# Patient Record
Sex: Female | Born: 1985 | Hispanic: Yes | Smoking: Never smoker
Health system: Southern US, Community
[De-identification: ages and names within clinical notes are randomized; demographics above are authoritative.]

## PROBLEM LIST (undated history)

## (undated) DIAGNOSIS — G459 Transient cerebral ischemic attack, unspecified: Secondary | ICD-10-CM

---

## 2015-03-19 ENCOUNTER — Encounter (HOSPITAL_COMMUNITY): Payer: Self-pay | Admitting: Emergency Medicine

## 2015-03-19 ENCOUNTER — Emergency Department (HOSPITAL_COMMUNITY): Payer: Self-pay

## 2015-03-19 ENCOUNTER — Emergency Department (HOSPITAL_COMMUNITY)
Admission: EM | Admit: 2015-03-19 | Discharge: 2015-03-19 | Disposition: A | Discharge status: Home / Self Care | Payer: Self-pay | Attending: Emergency Medicine | Admitting: Emergency Medicine

## 2015-03-19 DIAGNOSIS — Z8673 Personal history of transient ischemic attack (TIA), and cerebral infarction without residual deficits: Secondary | ICD-10-CM | POA: Insufficient documentation

## 2015-03-19 DIAGNOSIS — Z3202 Encounter for pregnancy test, result negative: Secondary | ICD-10-CM | POA: Insufficient documentation

## 2015-03-19 DIAGNOSIS — F439 Reaction to severe stress, unspecified: Secondary | ICD-10-CM | POA: Insufficient documentation

## 2015-03-19 DIAGNOSIS — R0789 Other chest pain: Secondary | ICD-10-CM | POA: Insufficient documentation

## 2015-03-19 DIAGNOSIS — F419 Anxiety disorder, unspecified: Secondary | ICD-10-CM | POA: Insufficient documentation

## 2015-03-19 HISTORY — DX: Transient cerebral ischemic attack, unspecified: G45.9

## 2015-03-19 LAB — CBC
HCT: 37.7 % (ref 36.0–46.0)
HEMOGLOBIN: 12 g/dL (ref 12.0–15.0)
MCH: 28.2 pg (ref 26.0–34.0)
MCHC: 31.8 g/dL (ref 30.0–36.0)
MCV: 88.7 fL (ref 78.0–100.0)
PLATELETS: 250 10*3/uL (ref 150–400)
RBC: 4.25 MIL/uL (ref 3.87–5.11)
RDW: 12.3 % (ref 11.5–15.5)
WBC: 5.1 10*3/uL (ref 4.0–10.5)

## 2015-03-19 LAB — BASIC METABOLIC PANEL
ANION GAP: 8 (ref 5–15)
BUN: <5 mg/dL — ABNORMAL LOW (ref 6–20)
CHLORIDE: 107 mmol/L (ref 101–111)
CO2: 26 mmol/L (ref 22–32)
Calcium: 9.5 mg/dL (ref 8.9–10.3)
Creatinine, Ser: 0.71 mg/dL (ref 0.44–1.00)
GFR calc non Af Amer: >60 mL/min (ref >60)
Glucose, Bld: 79 mg/dL (ref 65–99)
Potassium: 4.4 mmol/L (ref 3.5–5.1)
Sodium: 141 mmol/L (ref 135–145)

## 2015-03-19 LAB — CBG MONITORING, ED: Glucose-Capillary: 84 mg/dL (ref 65–99)

## 2015-03-19 LAB — I-STAT TROPONIN, ED: TROPONIN I, POC: 0 ng/mL (ref 0.00–0.08)

## 2015-03-19 LAB — I-STAT BETA HCG BLOOD, ED (MC, WL, AP ONLY)

## 2015-03-19 MED ORDER — DIAZEPAM 5 MG PO TABS: 5.0000 mg | ORAL_TABLET | Freq: Two times a day (BID) | ORAL | Status: AC | PRN

## 2015-03-19 MED ORDER — FAMOTIDINE 20 MG PO TABS: 20.0000 mg | ORAL_TABLET | Freq: Two times a day (BID) | ORAL | Status: AC

## 2015-03-19 MED ORDER — GI COCKTAIL ~~LOC~~
30.0000 mL | Freq: Once | ORAL | Status: AC
  Administered 2015-03-19: 30 mL via ORAL
  Filled 2015-03-19: qty 30

## 2015-03-19 NOTE — Discharge Instructions (Signed)
Read the information below.  Use the prescribed medication as directed.  Please discuss all new medications with your pharmacist.  You may return to the Emergency Department at any time for worsening condition or any new symptoms that concern you.   If you develop worsening chest pain, shortness of breath, fever, you pass out, or become weak or dizzy, return to the ER for a recheck.      Nonspecific Chest Pain  Chest pain can be caused by many different conditions. There is always a chance that your pain could be related to something serious, such as a heart attack or a blood clot in your lungs. Chest pain can also be caused by conditions that are not life-threatening. If you have chest pain, it is very important to follow up with your health care provider. CAUSES  Chest pain can be caused by:  Heartburn.  Pneumonia or bronchitis.  Anxiety or stress.  Inflammation around your heart (pericarditis) or lung (pleuritis or pleurisy).  A blood clot in your lung.  A collapsed lung (pneumothorax). It can develop suddenly on its own (spontaneous pneumothorax) or from trauma to the chest.  Shingles infection (varicella-zoster virus).  Heart attack.  Damage to the bones, muscles, and cartilage that make up your chest wall. This can include:  Bruised bones due to injury.  Strained muscles or cartilage due to frequent or repeated coughing or overwork.  Fracture to one or more ribs.  Sore cartilage due to inflammation (costochondritis). RISK FACTORS  Risk factors for chest pain may include:  Activities that increase your risk for trauma or injury to your chest.  Respiratory infections or conditions that cause frequent coughing.  Medical conditions or overeating that can cause heartburn.  Heart disease or family history of heart disease.  Conditions or health behaviors that increase your risk of developing a blood clot.  Having had chicken pox (varicella zoster). SIGNS AND  SYMPTOMS Chest pain can feel like:  Burning or tingling on the surface of your chest or deep in your chest.  Crushing, pressure, aching, or squeezing pain.  Dull or sharp pain that is worse when you move, cough, or take a deep breath.  Pain that is also felt in your back, neck, shoulder, or arm, or pain that spreads to any of these areas. Your chest pain may come and go, or it may stay constant. DIAGNOSIS Lab tests or other studies may be needed to find the cause of your pain. Your health care provider may have you take a test called an ambulatory ECG (electrocardiogram). An ECG records your heartbeat patterns at the time the test is performed. You may also have other tests, such as:  Transthoracic echocardiogram (TTE). During echocardiography, sound waves are used to create a picture of all of the heart structures and to look at how blood flows through your heart.  Transesophageal echocardiogram (TEE).This is a more advanced imaging test that obtains images from inside your body. It allows your health care provider to see your heart in finer detail.  Cardiac monitoring. This allows your health care provider to monitor your heart rate and rhythm in real time.  Holter monitor. This is a portable device that records your heartbeat and can help to diagnose abnormal heartbeats. It allows your health care provider to track your heart activity for several days, if needed.  Stress tests. These can be done through exercise or by taking medicine that makes your heart beat more quickly.  Blood tests.  Imaging tests.  TREATMENT  Your treatment depends on what is causing your chest pain. Treatment may include:  Medicines. These may include:  Acid blockers for heartburn.  Anti-inflammatory medicine.  Pain medicine for inflammatory conditions.  Antibiotic medicine, if an infection is present.  Medicines to dissolve blood clots.  Medicines to treat coronary artery disease.  Supportive  care for conditions that do not require medicines. This may include:  Resting.  Applying heat or cold packs to injured areas.  Limiting activities until pain decreases. HOME CARE INSTRUCTIONS  If you were prescribed an antibiotic medicine, finish it all even if you start to feel better.  Avoid any activities that bring on chest pain.  Do not use any tobacco products, including cigarettes, chewing tobacco, or electronic cigarettes. If you need help quitting, ask your health care provider.  Do not drink alcohol.  Take medicines only as directed by your health care provider.  Keep all follow-up visits as directed by your health care provider. This is important. This includes any further testing if your chest pain does not go away.  If heartburn is the cause for your chest pain, you may be told to keep your head raised (elevated) while sleeping. This reduces the chance that acid will go from your stomach into your esophagus.  Make lifestyle changes as directed by your health care provider. These may include:  Getting regular exercise. Ask your health care provider to suggest some activities that are safe for you.  Eating a heart-healthy diet. A registered dietitian can help you to learn healthy eating options.  Maintaining a healthy weight.  Managing diabetes, if necessary.  Reducing stress. SEEK MEDICAL CARE IF:  Your chest pain does not go away after treatment.  You have a rash with blisters on your chest.  You have a fever. SEEK IMMEDIATE MEDICAL CARE IF:   Your chest pain is worse.  You have an increasing cough, or you cough up blood.  You have severe abdominal pain.  You have severe weakness.  You faint.  You have chills.  You have sudden, unexplained chest discomfort.  You have sudden, unexplained discomfort in your arms, back, neck, or jaw.  You have shortness of breath at any time.  You suddenly start to sweat, or your skin gets clammy.  You feel  nauseous or you vomit.  You suddenly feel light-headed or dizzy.  Your heart begins to beat quickly, or it feels like it is skipping beats. These symptoms may represent a serious problem that is an emergency. Do not wait to see if the symptoms will go away. Get medical help right away. Call your local emergency services (911 in the [place] Do not drive yourself to the hospital.   This information is not intended to replace advice given to you by your health care provider. Make sure you discuss any questions you have with your health care provider.   Document Released: 10/27/2004 Document Revised: 02/07/2014 Document Reviewed: 08/23/2013 Elsevier Interactive Patient Education 2016 [place] and Stress Management Stress is a normal reaction to life events. It is what you feel when life demands more than you are used to or more than you can handle. Some stress can be useful. For example, the stress reaction can help you catch the last bus of the day, study for a test, or meet a deadline at work. But stress that occurs too often or for too long can cause problems. It can affect your emotional health and interfere with relationships and  normal daily activities. Too much stress can weaken your immune system and increase your risk for physical illness. If you already have a medical problem, stress can make it worse. CAUSES  All sorts of life events may cause stress. An event that causes stress for one person may not be stressful for another person. Major life events commonly cause stress. These may be positive or negative. Examples include losing your job, moving into a new home, getting married, having a baby, or losing a loved one. Less obvious life events may also cause stress, especially if they occur day after day or in combination. Examples include working long hours, driving in traffic, caring for children, being in debt, or being in a difficult relationship. SIGNS AND SYMPTOMS Stress  may cause emotional symptoms including, the following:  Anxiety. This is feeling worried, afraid, on edge, overwhelmed, or out of control.  Anger. This is feeling irritated or impatient.  Depression. This is feeling sad, down, helpless, or guilty.  Difficulty focusing, remembering, or making decisions. Stress may cause physical symptoms, including the following:   Aches and pains. These may affect your head, neck, back, stomach, or other areas of your body.  Tight muscles or clenched jaw.  Low energy or trouble sleeping. Stress may cause unhealthy behaviors, including the following:   Eating to feel better (overeating) or skipping meals.  Sleeping too little, too much, or both.  Working too much or putting off tasks (procrastination).  Smoking, drinking alcohol, or using drugs to feel better. DIAGNOSIS  Stress is diagnosed through an assessment by your health care provider. Your health care provider will ask questions about your symptoms and any stressful life events.Your health care provider will also ask about your medical history and may order blood tests or other tests. Certain medical conditions and medicine can cause physical symptoms similar to stress. Mental illness can cause emotional symptoms and unhealthy behaviors similar to stress. Your health care provider may refer you to a mental health professional for further evaluation.  TREATMENT  Stress management is the recommended treatment for stress.The goals of stress management are reducing stressful life events and coping with stress in healthy ways.  Techniques for reducing stressful life events include the following:  Stress identification. Self-monitor for stress and identify what causes stress for you. These skills may help you to avoid some stressful events.  Time management. Set your priorities, keep a calendar of events, and learn to say "no." These tools can help you avoid making too many  commitments. Techniques for coping with stress include the following:  Rethinking the problem. Try to think realistically about stressful events rather than ignoring them or overreacting. Try to find the positives in a stressful situation rather than focusing on the negatives.  Exercise. Physical exercise can release both physical and emotional tension. The key is to find a form of exercise you enjoy and do it regularly.  Relaxation techniques. These relax the body and mind. Examples include yoga, meditation, tai chi, biofeedback, deep breathing, progressive muscle relaxation, listening to music, being out in nature, journaling, and other hobbies. Again, the key is to find one or more that you enjoy and can do regularly.  Healthy lifestyle. Eat a balanced diet, get plenty of sleep, and do not smoke. Avoid using alcohol or drugs to relax.  Strong support network. Spend time with family, friends, or other people you enjoy being around.Express your feelings and talk things over with someone you trust. Counseling or talktherapy with a mental  health professional may be helpful if you are having difficulty managing stress on your own. Medicine is typically not recommended for the treatment of stress.Talk to your health care provider if you think you need medicine for symptoms of stress. HOME CARE INSTRUCTIONS  Keep all follow-up visits as directed by your health care provider.  Take all medicines as directed by your health care provider. SEEK MEDICAL CARE IF:  Your symptoms get worse or you start having new symptoms.  You feel overwhelmed by your problems and can no longer manage them on your own. SEEK IMMEDIATE MEDICAL CARE IF:  You feel like hurting yourself or someone else.   This information is not intended to replace advice given to you by your health care provider. Make sure you discuss any questions you have with your health care provider.   Document Released: 07/13/2000 Document  Revised: 02/07/2014 Document Reviewed: 09/11/2012 Elsevier Interactive Patient Education [employer].

## 2015-03-19 NOTE — ED Provider Notes (Signed)
CSN: [id number]     Arrival date & time 03/19/15  1249 History   First MD Initiated Contact with Patient 03/19/15 1432     Chief Complaint  Patient presents with  . Chest Pain     (Consider location/radiation/quality/duration/timing/severity/associated sxs/prior Treatment) HPI   Pt with hx TIA, HTN, acid reflux  presents with left sided chest pain, left arm tingling, episode of confused or slurred speech that is now resolved with exception of intermittently tingling left upper extremity from the elbow through all fingers.   She ate cheese avocado tacos last night at 11pm and went to sleep at 3am, woke with the aching left chest at 6:40am.  States she woke up with the aching pain in her left chest, it was constant throughout the day, described as pinching and turning.  When the pain escalated she developed tingling in her left arm, SOB, "twisting" of her words involving people not understanding her when she announced their orders at work.  She notes that she is sleep deprived, only slept 3 hours last night, has been working as much as possible, has an impending move back to [place], [place], and is very stressed.   She currently has an "empty feeling" in her left lower chest.  Denies family hx early cardiac death, early CAD.  Father had stroke at 30.   She takes omega fish oil and biotin as her daily medications.   Denies fevers, cough, abdominal pain, recent vomiting, weakness of any extremity.   Past Medical History  Diagnosis Date  . TIA (transient ischemic attack)    History reviewed. No pertinent past surgical history. History reviewed. No pertinent family history. Social History  Substance Use Topics  . Smoking status: Never Smoker   . Smokeless tobacco: None  . Alcohol Use: No   OB History    No data available     Review of Systems  All other systems reviewed and are negative.     Allergies  Tramadol  Home Medications   Prior to Admission medications   Not on File    BP 151/110 mmHg  Pulse 71  Temp(Src) 97.9 F (36.6 C) (Oral)  Resp 16  SpO2 100%  LMP 03/17/2015 Physical Exam  Constitutional: She appears well-developed and well-nourished. No distress.  HENT:  Head: Normocephalic and atraumatic.  Neck: Neck supple.  Cardiovascular: Normal rate and regular rhythm.   Pulmonary/Chest: Effort normal and breath sounds normal. No respiratory distress. She has no wheezes. She has no rales.  Abdominal: Soft. She exhibits no distension. There is no tenderness. There is no rebound and no guarding.  Neurological: She is alert.  CN II-XII intact, EOMs intact, no pronator drift, grip strengths equal bilaterally; strength 5/5 in all extremities, sensation intact in all extremities; finger to nose, heel to shin, rapid alternating movements normal; gait is normal.     Skin: She is not diaphoretic.  Nursing note and vitals reviewed.   ED Course  Procedures (including critical care time) Labs Review Labs Reviewed  BASIC METABOLIC PANEL - Abnormal; Notable for the following:    BUN <5 (*)    All other components within normal limits  CBC  I-STAT TROPOININ, ED  I-STAT BETA HCG BLOOD, ED ([facility], [facility], [facility] ONLY)  CBG MONITORING, ED    Imaging Review Dg Chest 2 View  03/19/2015  CLINICAL DATA:  Left side chest pain starting today, left arm numbness EXAM: CHEST  2 VIEW COMPARISON:  None. FINDINGS: The heart size and mediastinal contours  are within normal limits. Both lungs are clear. The visualized skeletal structures are unremarkable. IMPRESSION: No active cardiopulmonary disease. Electronically Signed   By: Natasha Mead M.D.   On: 03/19/2015 13:50   I have personally reviewed and evaluated these images and lab results as part of my medical decision-making.   EKG Interpretation   Date/Time:  Thursday March 19 2015 12:55:58 EST Ventricular Rate:  69 PR Interval:  144 QRS Duration: 94 QT Interval:  364 QTC Calculation: 390 R Axis:   84 Text Interpretation:   Normal sinus rhythm with sinus arrhythmia Normal ECG  No previous ECGs available Confirmed by LITTLE MD, RACHEL [phone]) on  03/19/2015 2:40:49 PM      MDM   Final diagnoses:  Atypical chest pain  Stress  Anxiety    Afebrile, nontoxic patient with 7 hours of left sided chest pain with associated symptoms of tingling, lightheadedness, SOB when the pain was bad.  This was not exertional.  PERC negative, HEART score is 2.  Workup reassuring.  Likely related to combination of acid reflux, extreme stress and exhaustion.  D/C home with valium, pepcid, recommend rest, PCP follow up, return precautions.  Discussed result, findings, treatment, and follow up  with patient.  Pt given return precautions.  Pt verbalizes understanding and agrees with plan.        I doubt any other EMC precluding discharge at this time including, but not necessarily limited to the following:  ACS, PE, aortic dissection, pneuothorax, pneumonia, CVA.     Trixie Dredge, PA-C 03/19/15 1552  Laurence Spates, MD 03/22/15 [phone]

## 2015-03-19 NOTE — ED Notes (Signed)
Pt sts left sided CP with some tingling down left arm and dizziness; pt sts CP started upon waking

## 2016-10-15 IMAGING — DX DG CHEST 2V
2 series · 2 of 2 positions shown · non-contrast
Comparison: None.

CLINICAL DATA: Left side chest pain starting today, left arm
numbness

EXAM:
CHEST  2 VIEW

[chest pa]
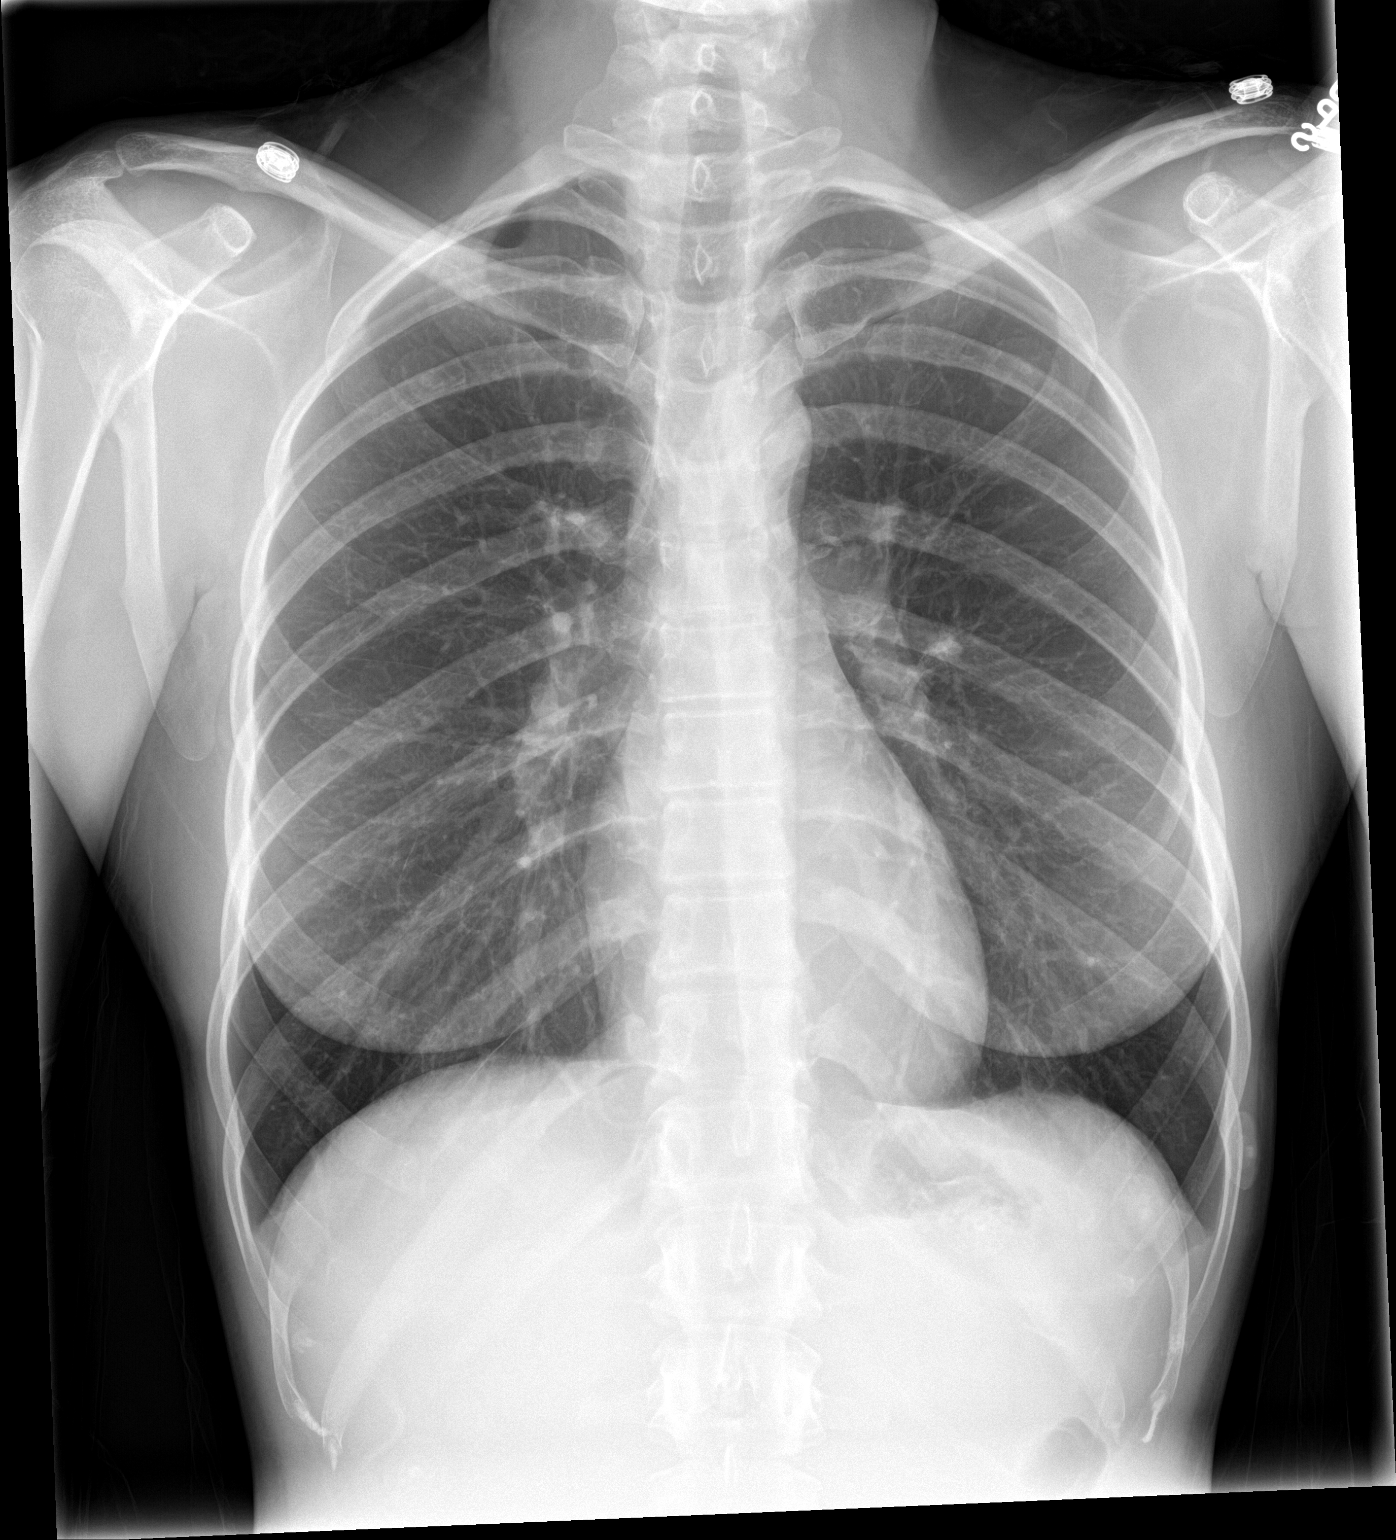

[chest lat]
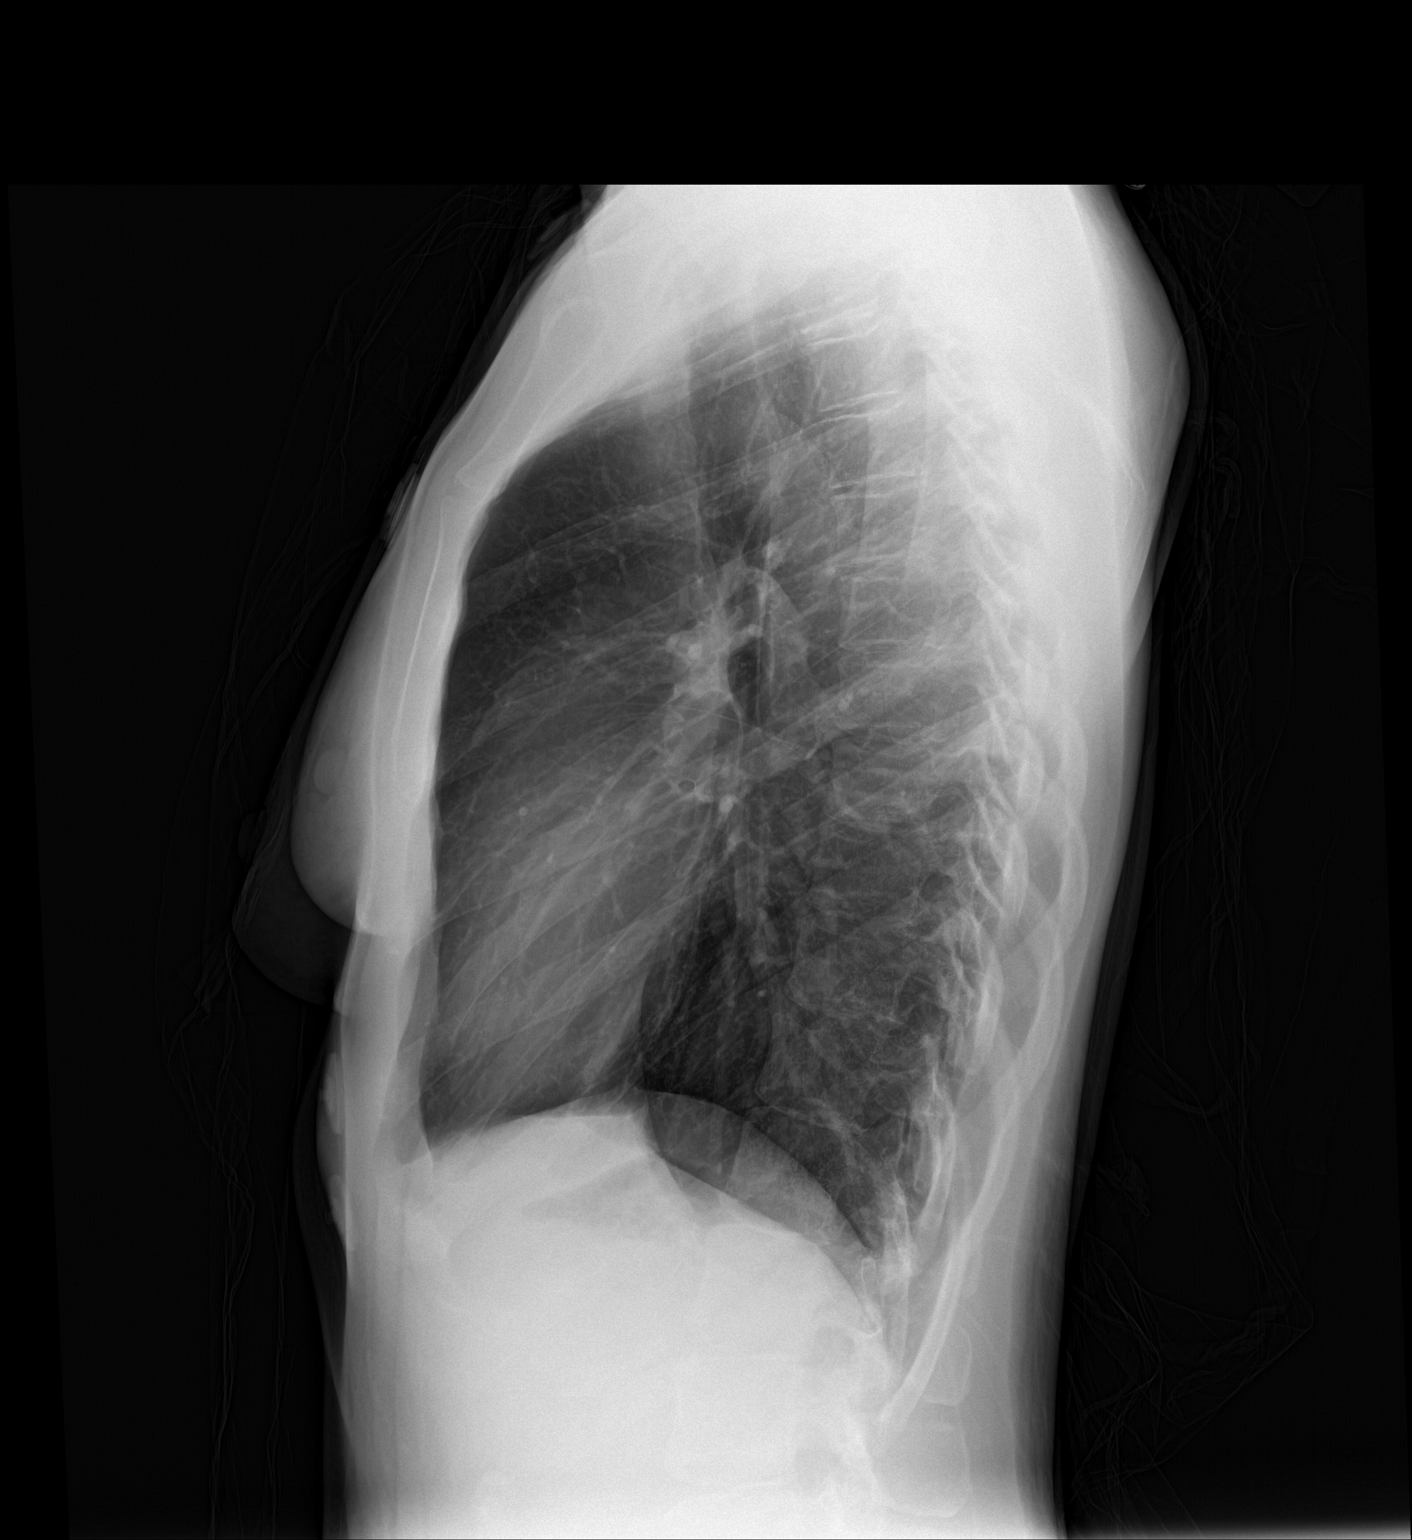

[2 of 2 positions shown; findings below may reference images not displayed]

FINDINGS: The heart size and mediastinal contours are within normal limits.
Both lungs are clear. The visualized skeletal structures are
unremarkable.
IMPRESSION: No active cardiopulmonary disease.

## 2017-12-20 ENCOUNTER — Other Ambulatory Visit: Payer: Self-pay

## 2017-12-20 ENCOUNTER — Emergency Department (HOSPITAL_COMMUNITY)
Admission: EM | Admit: 2017-12-20 | Discharge: 2017-12-20 | Disposition: A | Discharge status: Home / Self Care | Payer: Self-pay | Attending: Emergency Medicine | Admitting: Emergency Medicine

## 2017-12-20 DIAGNOSIS — Z8742 Personal history of other diseases of the female genital tract: Secondary | ICD-10-CM

## 2017-12-20 DIAGNOSIS — Z79899 Other long term (current) drug therapy: Secondary | ICD-10-CM | POA: Insufficient documentation

## 2017-12-20 DIAGNOSIS — N921 Excessive and frequent menstruation with irregular cycle: Secondary | ICD-10-CM | POA: Insufficient documentation

## 2017-12-20 LAB — I-STAT CHEM 8, ED
BUN: 9 mg/dL (ref 6–20)
CALCIUM ION: 1.23 mmol/L (ref 1.15–1.40)
CHLORIDE: 105 mmol/L (ref 98–111)
Creatinine, Ser: 1 mg/dL (ref 0.44–1.00)
GLUCOSE: 82 mg/dL (ref 70–99)
HCT: 36 % (ref 36.0–46.0)
Hemoglobin: 12.2 g/dL (ref 12.0–15.0)
POTASSIUM: 3.8 mmol/L (ref 3.5–5.1)
Sodium: 141 mmol/L (ref 135–145)
TCO2: 27 mmol/L (ref 22–32)

## 2017-12-20 LAB — I-STAT BETA HCG BLOOD, ED (MC, WL, AP ONLY): I-stat hCG, quantitative: <5 m[IU]/mL (ref <5)

## 2017-12-20 NOTE — ED Provider Notes (Signed)
[facility] EMERGENCY DEPARTMENT Provider Note   CSN: [id number] Arrival date & time: 12/20/17  2123     History   Chief Complaint Chief Complaint  Patient presents with  . Vaginal Bleeding    HPI Andrea Joyce is a 32 y.o. female.  HPI  32 year old female G3, P3 today complaining of vaginal bleeding.  States her last menstrual period was October 27 and was a normal period.  She states that she has had intermittent bleeding over the past 20 days.  She is due to start her period again today and has had increased bleeding today.  She states that she was seen at [facility] and told that she possibly had an ectopic pregnancy.  She presents today because of the ongoing bleeding.  She denies any lightheadedness, fever, abnormal vaginal discharge, abdominal pain, or, or chills.  Past Medical History:  Diagnosis Date  . TIA (transient ischemic attack)     There are no active problems to display for this patient.   No past surgical history on file.   OB History   None      Home Medications    Prior to Admission medications   Medication Sig Start Date End Date Taking? Authorizing Provider  BIOTIN PO Take 1 tablet by mouth daily.    [provider]  diazepam (VALIUM) 5 MG tablet Take 1 tablet (5 mg total) by mouth 2 (two) times daily as needed for anxiety (or sleep). 03/19/15   Trixie Dredge, PA-C  famotidine (PEPCID) 20 MG tablet Take 1 tablet (20 mg total) by mouth 2 (two) times daily. 03/19/15   Trixie Dredge, PA-C  Omega 3 1000 MG CAPS Take 1 capsule by mouth daily.    [provider]    Family History No family history on file.  Social History Social History   Tobacco Use  . Smoking status: Never Smoker  Substance Use Topics  . Alcohol use: No  . Drug use: No     Allergies   Lortab [hydrocodone-acetaminophen]; Percocet [oxycodone-acetaminophen]; and Tramadol   Review of Systems Review of Systems  All other systems reviewed  and are negative.    Physical Exam Updated Vital Signs BP 125/85 (BP Location: Right Arm)   Pulse 86   Temp 98.2 F (36.8 C) (Oral)   Resp 19   Ht 1.651 m (5\' 5" )   Wt 52.7 kg   LMP 11/20/2017 (Exact Date)   SpO2 100%   BMI 19.35 kg/m   Physical Exam  Constitutional: She is oriented to person, place, and time. She appears well-developed and well-nourished. No distress.  HENT:  Head: Normocephalic and atraumatic.  Right Ear: External ear normal.  Left Ear: External ear normal.  Nose: Nose normal.  Eyes: Pupils are equal, round, and reactive to light. Conjunctivae and EOM are normal.  Neck: Normal range of motion. Neck supple.  Cardiovascular: Normal rate, regular rhythm and normal heart sounds.  Pulmonary/Chest: Effort normal.  Abdominal: Soft. Bowel sounds are normal.  Genitourinary: Vagina normal.  Genitourinary Comments: There is blood in the vaginal vault consistent with normal menstrual bleeding  Musculoskeletal: Normal range of motion.  Neurological: She is alert and oriented to person, place, and time. She exhibits normal muscle tone. Coordination normal.  Skin: Skin is warm and dry.  Psychiatric: She has a normal mood and affect. Her behavior is normal. Thought content normal.  Nursing note and vitals reviewed.    ED Treatments / Results  Labs (all labs ordered are  listed, but only abnormal results are displayed) Labs Reviewed  I-STAT CHEM 8, ED  I-STAT BETA HCG BLOOD, ED ([facility], [facility], [facility] ONLY)    EKG None  Radiology No results found.  Procedures Procedures (including critical care time)  Medications Ordered in ED Medications - No data to display   Initial Impression / Assessment and Plan / ED Course  I have reviewed the triage vital signs and the nursing notes.  Pertinent labs & imaging results that were available during my care of the patient were reviewed by me and considered in my medical decision making (see chart for details).      32 year old female presents today with vaginal bleeding.  Today's today she is due to start her menstrual cycle.  Vital signs are stable.  Hemoglobin is stable.  Pregnancy test is negative.  She denies any urinary tract infection symptoms.  She appears stable for discharge.  Final Clinical Impressions(s) / ED Diagnoses   Final diagnoses:  History of irregular menstrual bleeding    ED Discharge Orders    None       Margarita Grizzleay, Xan Ingraham, MD 12/20/17 2312

## 2017-12-20 NOTE — Discharge Instructions (Addendum)
Your labs revealed no evidence of pregnancy.  Your blood count is stable.  Please follow-up with a gynecologist.

## 2017-12-20 NOTE — ED Triage Notes (Signed)
Patient c/o intermittent vaginal bleeding since October (approximately 20 days). Was [facility] by [facility] and was prescribed Flagyl. States was dx with an "ectopic pregnancy".
